# Patient Record
Sex: Male | Born: 1990 | Hispanic: Refuse to answer | Marital: Single | Smoking: Current every day smoker
Health system: Southern US, Community
[De-identification: ages and names within clinical notes are randomized; demographics above are authoritative.]

---
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
## 2020-01-13 NOTE — Progress Notes (Deleted)
Psychiatric Initial Adult Assessment   Patient Identification: William Kelly MRN:  [id number] Date of Evaluation:  01/13/2020 Referral Source: *** Chief Complaint:   Visit Diagnosis: No diagnosis found.  History of Present Illness:   William Kelly is a 29 y.o. year old male with a history of anxiety, who is referred for anxiety.          Associated Signs/Symptoms: Depression Symptoms:  {DEPRESSION SYMPTOMS:20000} (Hypo) Manic Symptoms:  {BHH MANIC SYMPTOMS:22872} Anxiety Symptoms:  {BHH ANXIETY SYMPTOMS:22873} Psychotic Symptoms:  {BHH PSYCHOTIC SYMPTOMS:22874} PTSD Symptoms: {BHH PTSD SYMPTOMS:22875}  Past Psychiatric History:  Outpatient:  Psychiatry admission:  Previous suicide attempt:  Past trials of medication:  History of violence:   Previous Psychotropic Medications: {YES/NO:21197}  Substance Abuse History in the last 12 months:  {yes no:314532}  Consequences of Substance Abuse: {BHH CONSEQUENCES OF SUBSTANCE ABUSE:22880}  Past Medical History: No past medical history on file. *** The histories are not reviewed yet. Please review them in the "History" navigator section and refresh this SmartLink.  Family Psychiatric History: ***  Family History: No family history on file.  Social History:   Social History   Socioeconomic History  . Marital status: Single    Spouse name: Not on file  . Number of children: Not on file  . Years of education: Not on file  . Highest education level: Not on file  Occupational History  . Not on file  Tobacco Use  . Smoking status: Not on file  Substance and Sexual Activity  . Alcohol use: Not on file  . Drug use: Not on file  . Sexual activity: Not on file  Other Topics Concern  . Not on file  Social History Narrative  . Not on file   Social Determinants of Health   Financial Resource Strain:   . Difficulty of Paying Living Expenses: Not on file  Food Insecurity:   . Worried About Brewing technologist in the Last Year: Not on file  . Ran Out of Food in the Last Year: Not on file  Transportation Needs:   . Lack of Transportation (Medical): Not on file  . Lack of Transportation (Non-Medical): Not on file  Physical Activity:   . Days of Exercise per Week: Not on file  . Minutes of Exercise per Session: Not on file  Stress:   . Feeling of Stress : Not on file  Social Connections:   . Frequency of Communication with Friends and Family: Not on file  . Frequency of Social Gatherings with Friends and Family: Not on file  . Attends Religious Services: Not on file  . Active Member of Clubs or Organizations: Not on file  . Attends Banker Meetings: Not on file  . Marital Status: Not on file    Additional Social History: ***  Allergies:  Not on File  Metabolic Disorder Labs: No results found for: HGBA1C, MPG No results found for: PROLACTIN No results found for: CHOL, TRIG, HDL, CHOLHDL, VLDL, LDLCALC No results found for: TSH  Therapeutic Level Labs: No results found for: LITHIUM No results found for: CBMZ No results found for: VALPROATE  Current Medications: No current outpatient medications on file.   No current facility-administered medications for this visit.    Musculoskeletal: Strength & Muscle Tone: N/A Gait & Station: N/A Patient leans: N/A  Psychiatric Specialty Exam: Review of Systems  There were no vitals taken for this visit.There is no height or weight on file to calculate BMI.  General Appearance: {Appearance:22683}  Eye Contact:  {BHH EYE CONTACT:22684}  Speech:  Clear and Coherent  Volume:  Normal  Mood:  {BHH MOOD:22306}  Affect:  {Affect (PAA):22687}  Thought Process:  Coherent  Orientation:  Full (Time, Place, and Person)  Thought Content:  Logical  Suicidal Thoughts:  {ST/HT (PAA):22692}  Homicidal Thoughts:  {ST/HT (PAA):22692}  Memory:  Immediate;   Good  Judgement:  {Judgement (PAA):22694}  Insight:  {Insight (PAA):22695}   Psychomotor Activity:  Normal  Concentration:  Concentration: Good and Attention Span: Good  Recall:  Good  Fund of Knowledge:Good  Language: Good  Akathisia:  No  Handed:  Right  AIMS (if indicated):  not done  Assets:  Communication Skills Desire for Improvement  ADL's:  Intact  Cognition: WNL  Sleep:  {BHH GOOD/FAIR/POOR:22877}   Screenings:   Assessment and Plan:  Assessment  Plan  The patient demonstrates the following risk factors for suicide: Chronic risk factors for suicide include: {Chronic Risk Factors for ZOXWRUE:45409811}. Acute risk factors for suicide include: {Acute Risk Factors for BJYNWGN:56213086}. Protective factors for this patient include: {Protective Factors for Suicide VHQI:69629528}. Considering these factors, the overall suicide risk at this point appears to be {Desc; low/moderate/high:110033}. Patient {ACTION; IS/IS UXL:24401027} appropriate for outpatient follow up.     Neysa Hotter, MD 10/21/20212:12 PM

## 2020-01-19 ENCOUNTER — Telehealth (HOSPITAL_COMMUNITY): Payer: No Typology Code available for payment source | Admitting: Psychiatry

## 2021-04-21 ENCOUNTER — Other Ambulatory Visit: Payer: Self-pay

## 2021-04-21 ENCOUNTER — Ambulatory Visit (INDEPENDENT_AMBULATORY_CARE_PROVIDER_SITE_OTHER): Payer: 59

## 2021-04-21 ENCOUNTER — Ambulatory Visit
Admission: EM | Admit: 2021-04-21 | Discharge: 2021-04-21 | Disposition: A | Discharge status: Home / Self Care | Payer: 59 | Attending: Family Medicine | Admitting: Family Medicine

## 2021-04-21 DIAGNOSIS — R059 Cough, unspecified: Secondary | ICD-10-CM

## 2021-04-21 DIAGNOSIS — J189 Pneumonia, unspecified organism: Secondary | ICD-10-CM | POA: Diagnosis not present

## 2021-04-21 DIAGNOSIS — R509 Fever, unspecified: Secondary | ICD-10-CM

## 2021-04-21 IMAGING — DX DG CHEST 2V
2 series · 2 of 2 positions shown · non-contrast
Comparison: Chest x-ray 05/12/2019.

CLINICAL DATA: 30-year-old male with history of fever and cough.

EXAM:
CHEST - 2 VIEW

[chest pa]
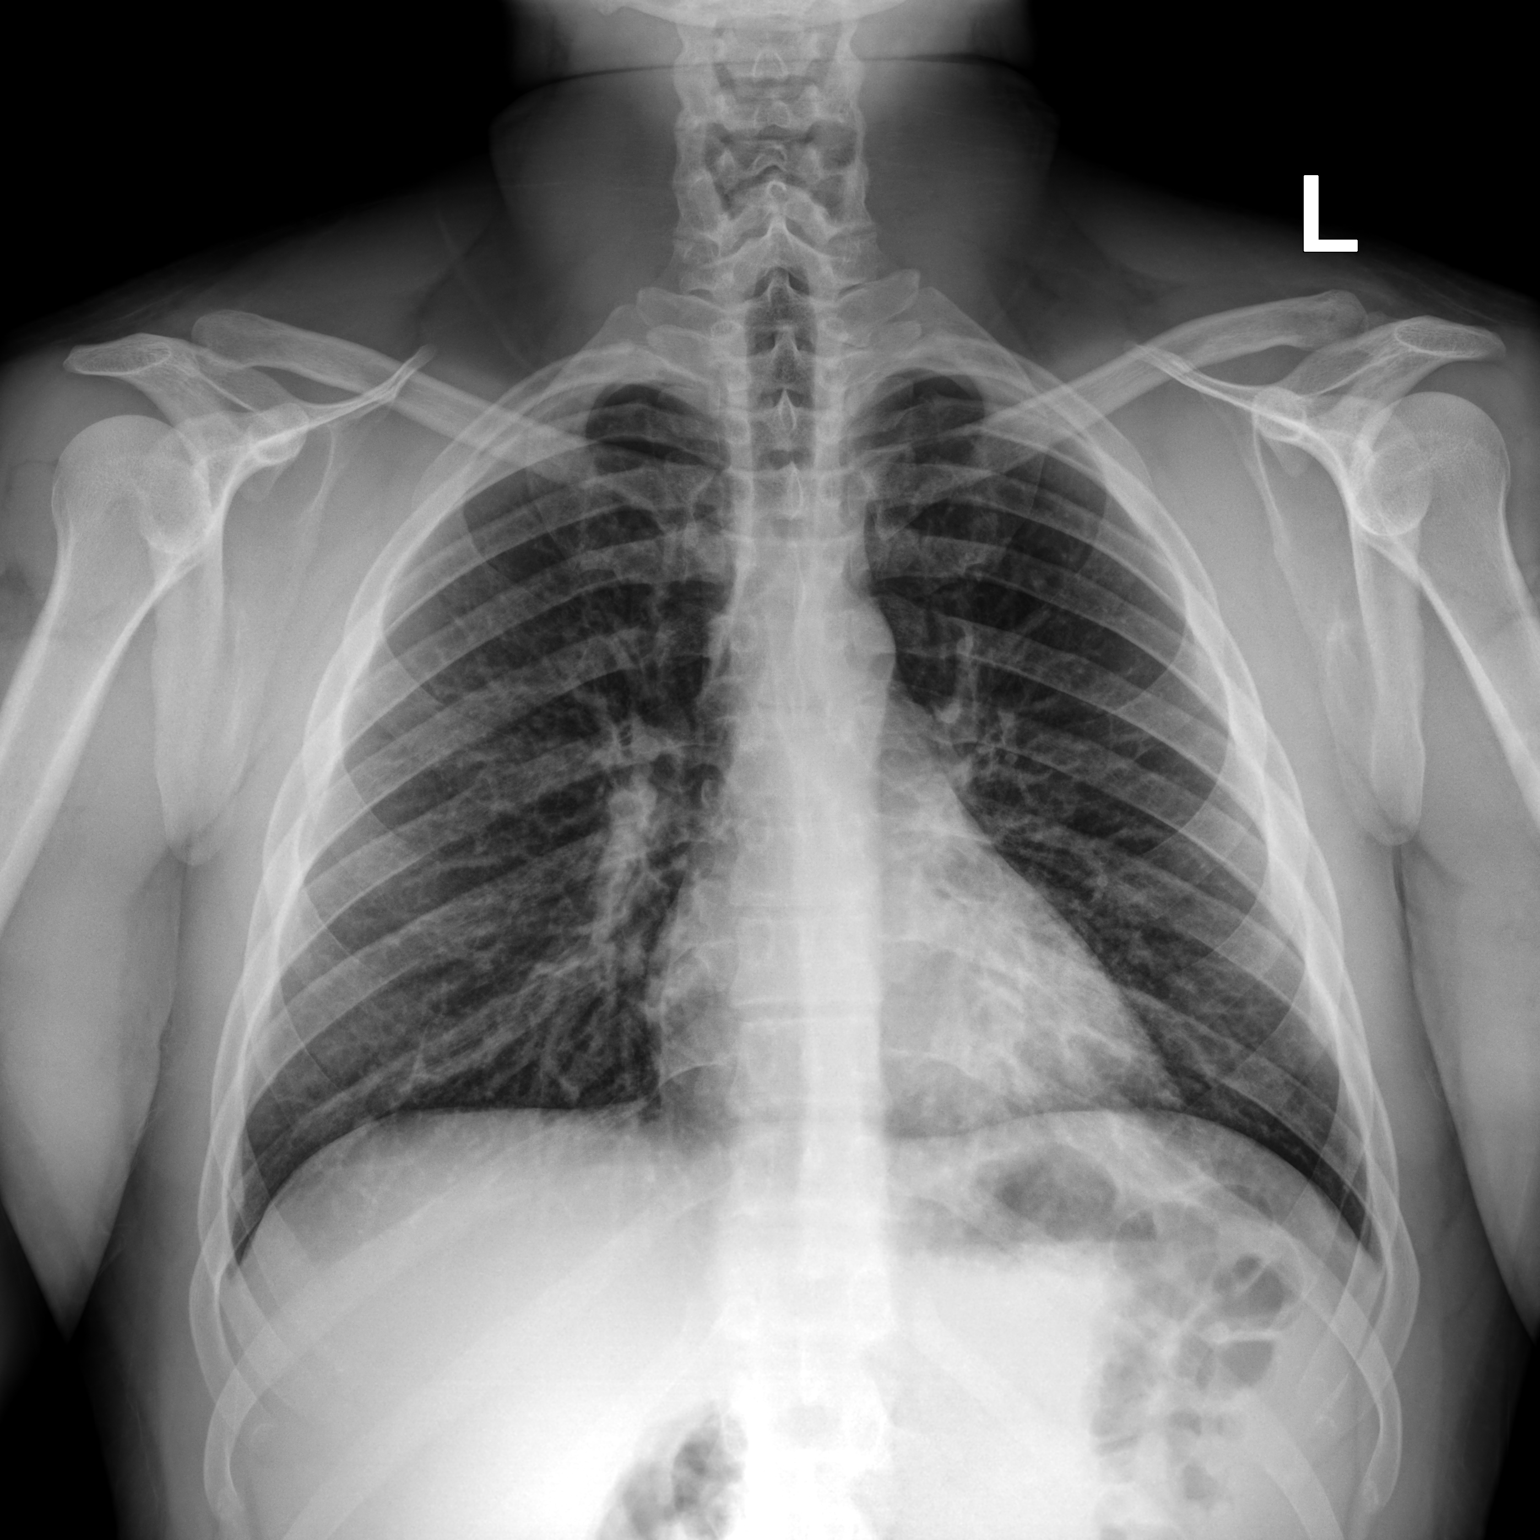

[chest lat]
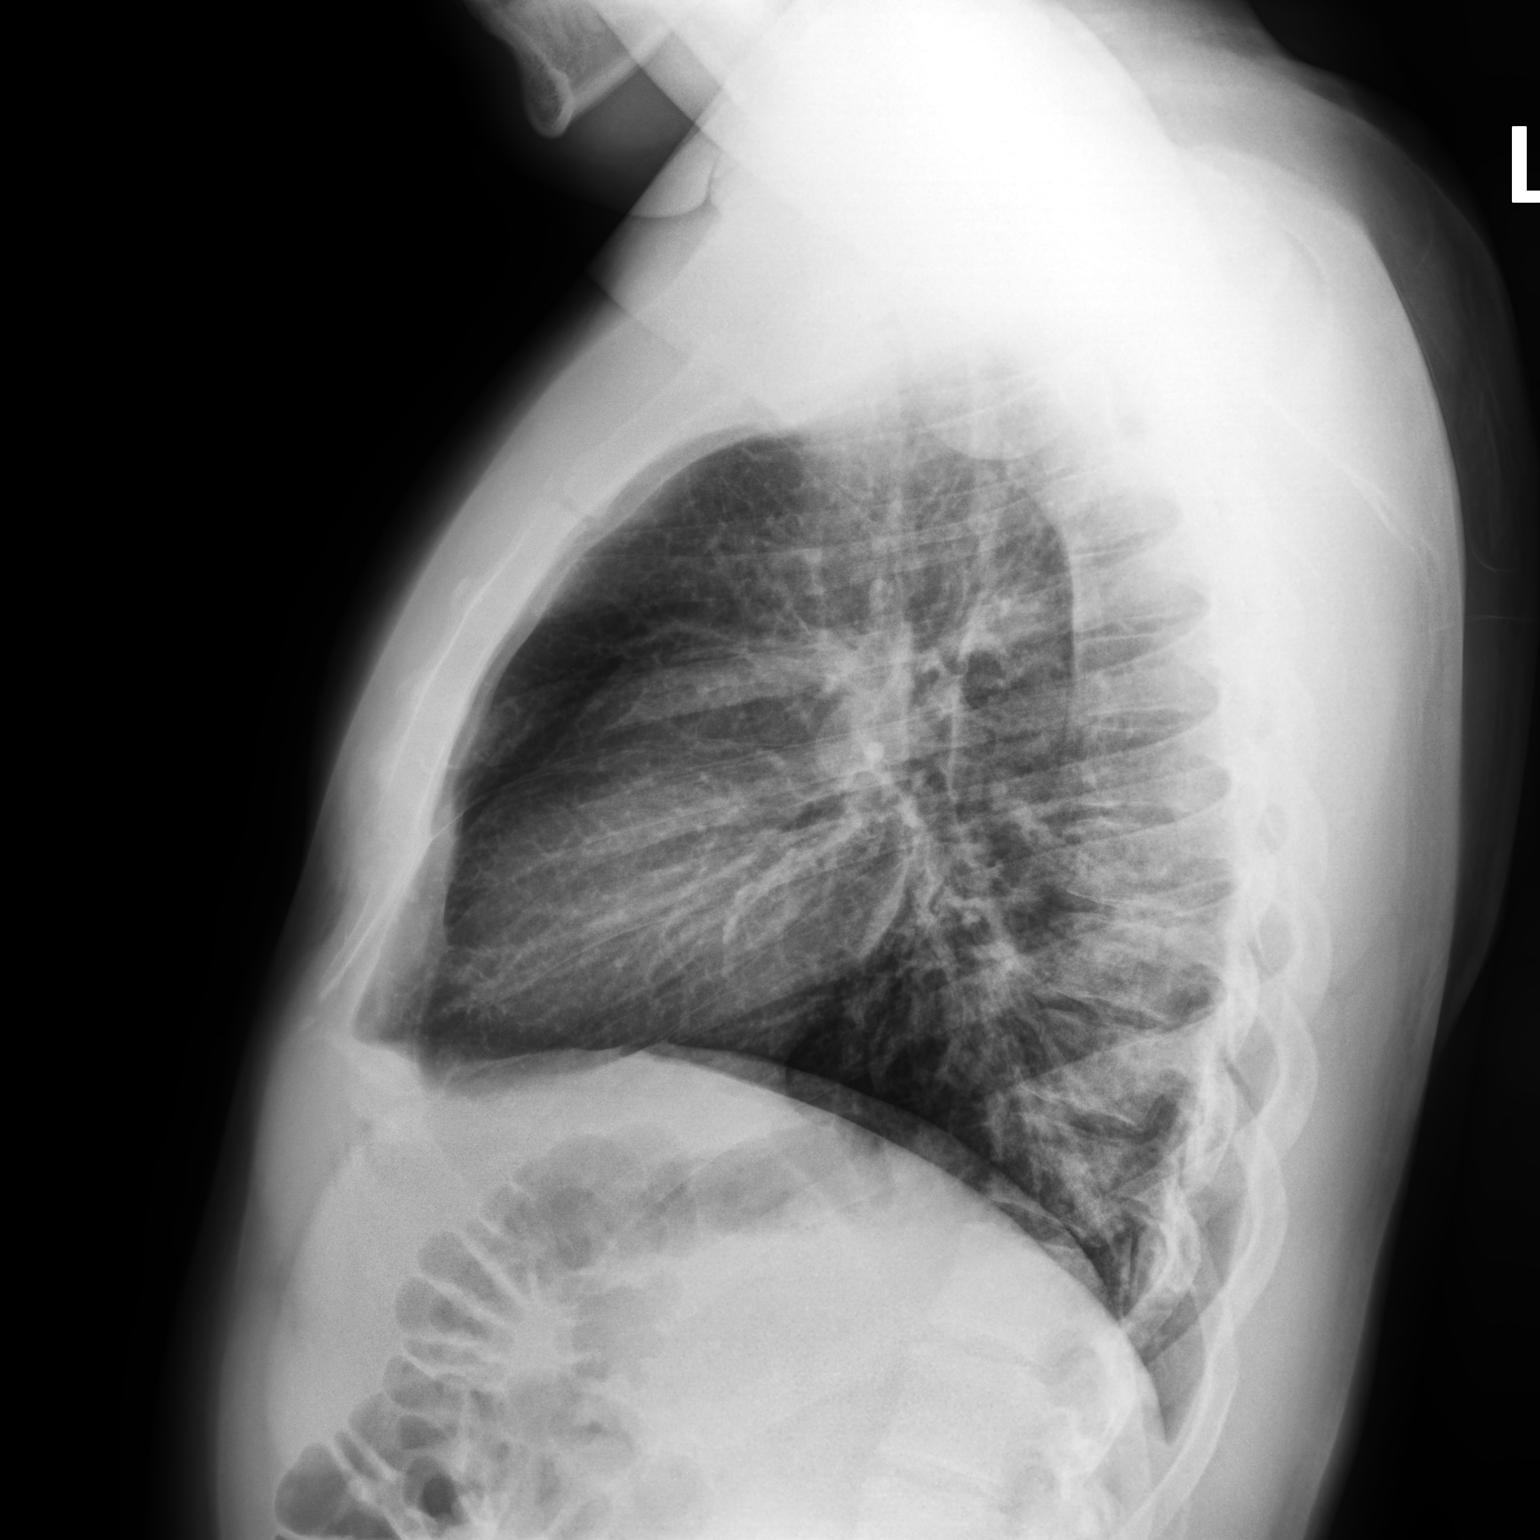

[2 of 2 positions shown; findings below may reference images not displayed]

FINDINGS: Lung volumes are normal. Ill-defined opacities are noted projecting
over the medial aspect of the left lower lobe posteriorly. No
pleural effusions. No pneumothorax. No pulmonary nodule or mass
noted. Pulmonary vasculature and the cardiomediastinal silhouette
are within normal limits.
IMPRESSION: 1. Findings are concerning for left lower lobe bronchopneumonia.

## 2021-04-21 MED ORDER — HYDROCODONE BIT-HOMATROP MBR 5-1.5 MG/5ML PO SOLN: 5.0000 mL | Freq: Four times a day (QID) | ORAL | 0 refills | Status: AC | PRN

## 2021-04-21 MED ORDER — DOXYCYCLINE HYCLATE 100 MG PO CAPS: 100.0000 mg | ORAL_CAPSULE | Freq: Two times a day (BID) | ORAL | 0 refills | Status: AC

## 2021-04-21 MED ORDER — IBUPROFEN 100 MG/5ML PO SUSP
800.0000 mg | Freq: Four times a day (QID) | ORAL | Status: DC
  Administered 2021-04-21: 800 mg via ORAL

## 2021-04-21 NOTE — ED Triage Notes (Signed)
Patient states he has some upper respiratory, runny nose, cough and fever throughout the night since Wednesday  Patient states he took Tylenol for the fever at [facility]

## 2021-04-21 NOTE — ED Provider Notes (Signed)
[facility]   [id number] 04/21/21 Arrival Time: 0808  ASSESSMENT & PLAN:  1. Pneumonia of left lower lobe due to infectious organism    I have personally viewed the imaging studies ordered this visit. L lower lobe infiltrates.  OTC symptom care as needed. Begin: Meds ordered this encounter  Medications   ibuprofen (ADVIL) 100 MG/5ML suspension 800 mg   doxycycline (VIBRAMYCIN) 100 MG capsule    Sig: Take 1 capsule (100 mg total) by mouth 2 (two) times daily.    Dispense:  20 capsule    Refill:  0   HYDROcodone bit-homatropine (HYCODAN) 5-1.5 MG/5ML syrup    Sig: Take 5 mLs by mouth every 6 (six) hours as needed for cough.    Dispense:  90 mL    Refill:  0      Follow-up Information     [facility].   Specialty: Urgent Care Why: If worsening or failing to improve as anticipated. Contact information: [address], [address] [place] [address] [phone]                Reviewed expectations re: course of current medical issues. Questions answered. Outlined signs and symptoms indicating need for more acute intervention. Understanding verbalized. After Visit Summary given.   SUBJECTIVE: History from: patient. UNNAMED William Kelly is a 31 y.o. male who reports: URI symptoms; past sev days; now with worsening cough and fever/chills relieved by Tylenol. Some fatigue. Denies: difficulty breathing. Normal PO intake without n/v/d.  Social History   Tobacco Use  Smoking Status Every Day   Packs/day: 0.50   Types: Cigarettes  Smokeless Tobacco Never    OBJECTIVE:  Vitals:   04/21/21 0816  BP: 126/75  Pulse: (!) 120  Resp: 18  Temp: (!) 100.7 F (38.2 C)  TempSrc: Oral  SpO2: 94%    Tachycardia noted. General appearance: alert; no distress Eyes: PERRLA; EOMI; conjunctiva normal HENT: [place]; AT; with nasal congestion Neck: supple  Lungs: speaks full sentences without difficulty; unlabored;  clear bilat Extremities: no edema Skin: warm and dry Neurologic: normal gait Psychological: alert and cooperative; normal mood and affect   Imaging: DG Chest 2 View  Result Date: 04/21/2021 CLINICAL DATA:  31 year old male with history of fever and cough. EXAM: CHEST - 2 VIEW COMPARISON:  Chest x-ray 05/12/2019. FINDINGS: Lung volumes are normal. Ill-defined opacities are noted projecting over the medial aspect of the left lower lobe posteriorly. No pleural effusions. No pneumothorax. No pulmonary nodule or mass noted. Pulmonary vasculature and the cardiomediastinal silhouette are within normal limits. IMPRESSION: 1. Findings are concerning for left lower lobe bronchopneumonia. Electronically Signed   By: Trudie Reed M.D.   On: 04/21/2021 08:58    Allergies  Allergen Reactions   Penicillins Hives    History reviewed. No pertinent past medical history. Social History   Socioeconomic History   Marital status: Single    Spouse name: Not on file   Number of children: Not on file   Years of education: Not on file   Highest education level: Not on file  Occupational History   Not on file  Tobacco Use   Smoking status: Every Day    Packs/day: 0.50    Types: Cigarettes   Smokeless tobacco: Never  Vaping Use   Vaping Use: Never used  Substance and Sexual Activity   Alcohol use: Yes    Comment: Occas   Drug use: Never   Sexual activity: Yes  Birth control/protection: None  Other Topics Concern   Not on file  Social History Narrative   Not on file   Social Determinants of Health   Financial Resource Strain: Not on file  Food Insecurity: Not on file  Transportation Needs: Not on file  Physical Activity: Not on file  Stress: Not on file  Social Connections: Not on file  Intimate Partner Violence: Not on file   Family History  Problem Relation Age of Onset   Diabetes Mother    Stroke Father    History reviewed. No pertinent surgical history.   Mardella Layman,  MD 04/21/21 [phone]

## 2021-11-07 ENCOUNTER — Other Ambulatory Visit: Payer: Self-pay | Admitting: Family Medicine

## 2021-11-07 ENCOUNTER — Other Ambulatory Visit (HOSPITAL_COMMUNITY): Payer: Self-pay | Admitting: Family Medicine

## 2021-11-07 DIAGNOSIS — R519 Headache, unspecified: Secondary | ICD-10-CM
